# Patient Record
Sex: Male | Born: 1945
Health system: Southern US, Community
[De-identification: ages and names within clinical notes are randomized; demographics above are authoritative.]

## PROBLEM LIST (undated history)

## (undated) DIAGNOSIS — E119 Type 2 diabetes mellitus without complications: Secondary | ICD-10-CM

## (undated) DIAGNOSIS — I509 Heart failure, unspecified: Secondary | ICD-10-CM

## (undated) DIAGNOSIS — I219 Acute myocardial infarction, unspecified: Secondary | ICD-10-CM

## (undated) DIAGNOSIS — J449 Chronic obstructive pulmonary disease, unspecified: Secondary | ICD-10-CM

## (undated) DIAGNOSIS — I10 Essential (primary) hypertension: Secondary | ICD-10-CM

## (undated) HISTORY — PX: CARDIAC SURGERY: SHX584

---

## 2016-04-13 ENCOUNTER — Emergency Department (HOSPITAL_COMMUNITY)
Admission: EM | Admit: 2016-04-13 | Discharge: 2016-04-13 | Disposition: A | Discharge status: Home / Self Care | Payer: Non-veteran care | Attending: Emergency Medicine | Admitting: Emergency Medicine

## 2016-04-13 ENCOUNTER — Encounter (HOSPITAL_COMMUNITY): Payer: Self-pay | Admitting: *Deleted

## 2016-04-13 DIAGNOSIS — L740 Miliaria rubra: Secondary | ICD-10-CM | POA: Diagnosis not present

## 2016-04-13 DIAGNOSIS — I509 Heart failure, unspecified: Secondary | ICD-10-CM | POA: Diagnosis not present

## 2016-04-13 DIAGNOSIS — E119 Type 2 diabetes mellitus without complications: Secondary | ICD-10-CM | POA: Insufficient documentation

## 2016-04-13 DIAGNOSIS — I110 Hypertensive heart disease with heart failure: Secondary | ICD-10-CM | POA: Insufficient documentation

## 2016-04-13 DIAGNOSIS — I252 Old myocardial infarction: Secondary | ICD-10-CM | POA: Diagnosis not present

## 2016-04-13 DIAGNOSIS — X32XXXA Exposure to sunlight, initial encounter: Secondary | ICD-10-CM

## 2016-04-13 DIAGNOSIS — R21 Rash and other nonspecific skin eruption: Secondary | ICD-10-CM | POA: Diagnosis present

## 2016-04-13 HISTORY — DX: Essential (primary) hypertension: I10

## 2016-04-13 HISTORY — DX: Acute myocardial infarction, unspecified: I21.9

## 2016-04-13 HISTORY — DX: Type 2 diabetes mellitus without complications: E11.9

## 2016-04-13 HISTORY — DX: Heart failure, unspecified: I50.9

## 2016-04-13 LAB — CBC
HEMATOCRIT: 42.4 % (ref 39.0–52.0)
HEMOGLOBIN: 13.5 g/dL (ref 13.0–17.0)
MCH: 31.8 pg (ref 26.0–34.0)
MCHC: 31.8 g/dL (ref 30.0–36.0)
MCV: 99.8 fL (ref 78.0–100.0)
Platelets: 278 10*3/uL (ref 150–400)
RBC: 4.25 MIL/uL (ref 4.22–5.81)
RDW: 13.8 % (ref 11.5–15.5)
WBC: 10.6 10*3/uL — ABNORMAL HIGH (ref 4.0–10.5)

## 2016-04-13 LAB — COMPREHENSIVE METABOLIC PANEL
ALBUMIN: 3.4 g/dL — AB (ref 3.5–5.0)
ALT: 23 U/L (ref 17–63)
ANION GAP: 8 (ref 5–15)
AST: 30 U/L (ref 15–41)
Alkaline Phosphatase: 54 U/L (ref 38–126)
BUN: 13 mg/dL (ref 6–20)
CO2: 27 mmol/L (ref 22–32)
Calcium: 9.6 mg/dL (ref 8.9–10.3)
Chloride: 101 mmol/L (ref 101–111)
Creatinine, Ser: 0.76 mg/dL (ref 0.61–1.24)
GFR calc Af Amer: >60 mL/min (ref >60)
GFR calc non Af Amer: >60 mL/min (ref >60)
GLUCOSE: 164 mg/dL — AB (ref 65–99)
POTASSIUM: 3.9 mmol/L (ref 3.5–5.1)
SODIUM: 136 mmol/L (ref 135–145)
Total Bilirubin: 0.5 mg/dL (ref 0.3–1.2)
Total Protein: 7.1 g/dL (ref 6.5–8.1)

## 2016-04-13 MED ORDER — HYDROCORTISONE 1 % EX CREA
TOPICAL_CREAM | CUTANEOUS | Status: DC
Start: 2016-04-13 — End: 2018-11-03

## 2016-04-13 NOTE — Discharge Instructions (Signed)
Heat Rash, Adult Heat rash (miliaria) is a skin condition that causes a rash of little red bumps around the openings to your sweat glands on your skin. It happens when sweat glands are blocked. There are four types of heat rash:  Miliaria crystallina. This is the mildest version. It causes small, clear blisters (vesicles) on the skin.  Miliaria rubra. In this type of heat rash, the ducts are blocked deeper in the skin. The rash is very itchy and consists of small bumps (papules). There may also be a fever.  Miliaria pustulosa. This rash is similar to miliaria rubra, but the bumps are pimples (pustules) rather than papules.  Miliaria profunda. The sweat ducts are blocked deep in the skin. Sometimes flesh-colored papules develop on the skin. This type can lead to heat exhaustion. It requires immediate medical attention. Most cases of heat rash can be managed easily. CAUSES  Heat rash is caused by blocked sweat glands. The sweat glands can become blocked by:  Wearing heavy or tight clothing during exercise or extreme heat.  Having a fever and sweating while in bed.  Certain medicines. RISK FACTORS This condition is more likely to develop:  In people who live or work in hot, humid climates.  During periods of intense exercise and sweating.  In people who are not accustomed to heat. SYMPTOMS  Symptoms of heat rash include:   Small vesicles that break open easily. These vesicles are usually found on the trunk of the body.  Clusters of itchy, red papules or pustules. These often develop on the neck and upper chest, in the groin, under the breasts, and in elbow creases. DIAGNOSIS  Your health care provider may suspect heat rash based on your symptoms. Your health care provider will also do a physical exam. This may include taking a sample of the fluid from your vesicles or pustules for testing. TREATMENT  Moving to a cool, dry place is the best treatment for heat rash. Treatment may also  include medicines, such as:  Corticosteroid creams for skin irritation.  Antibiotic medicines for an infection. HOME CARE INSTRUCTIONS  Skin care  Keep the affected area dry.  Do not use ointments or creams. These can make the condition worse.  Apply cool compresses to the affected areas.  Do not scratch your skin.  Do not take hot showers or baths. General Instructions  Take over-the-counter and prescription medicines only as told by your health care provider.  If you were prescribed an antibiotic, take it as told by your health care provider. Do not stop taking it even if your condition improves.  Stay in a cool room as much as possible. Use an air conditioner or fan, if possible.  Do not wear tight clothes. Wear comfortable, loose-fitting clothing.  Keep all follow-up visits as told by your health care provider. This is important. SEEK MEDICAL CARE IF:   Your heat rash does not go away within several days.  Your heat rash gets worse.  You have a fever or chills. SEEK IMMEDIATE MEDICAL CARE IF:   You are having cramps or muscle contractions.  You have chest pain.  You have trouble breathing.  You faint.   This information is not intended to replace advice given to you by your health care provider. Make sure you discuss any questions you have with your health care provider.   Document Released: 09/21/2009 Document Revised: 06/24/2015 Document Reviewed: 02/18/2015 Elsevier Interactive Patient Education 2016 [employer]  

## 2016-04-13 NOTE — ED Notes (Signed)
The pt is on home 02  And at present

## 2016-04-13 NOTE — ED Notes (Signed)
The pt has stents in his heart but does not take blood thinners.  He has no old records here

## 2016-04-13 NOTE — ED Provider Notes (Signed)
CSN: [id number]     Arrival date & time 04/13/16  2031 History   First MD Initiated Contact with Patient 04/13/16 2151     Chief Complaint  Patient presents with  . Rash     (Consider location/radiation/quality/duration/timing/severity/associated sxs/prior Treatment) Patient is a 70 y.o. male presenting with rash. The history is provided by the patient.  Rash Location:  Torso Torso rash location: chest/abd. Quality: itchiness and redness   Quality: not painful, not peeling and not swelling   Severity:  Moderate Onset quality:  Sudden Duration:  2 days Timing:  Constant Progression:  Improving Chronicity:  New Context: sun exposure   Context comment:  Pt went outside and sitting with his shirt unbuttoned in the sone and after 10min he was bright red Relieved by:  Nothing Worsened by:  Nothing tried Ineffective treatments: ammonia lotion. Associated symptoms: no abdominal pain, no fever, no joint pain, no myalgias, no throat swelling, no tongue swelling, no URI and not wheezing     Past Medical History  Diagnosis Date  . Hypertension   . Diabetes mellitus without complication (HCC)   . CHF (congestive heart failure) (HCC)   . MI (myocardial infarction) (HCC)    History reviewed. No pertinent past surgical history. No family history on file. Social History  Substance Use Topics  . Smoking status: Never Smoker   . Smokeless tobacco: None  . Alcohol Use: No    Review of Systems  Constitutional: Negative for fever.  Respiratory: Negative for wheezing.   Gastrointestinal: Negative for abdominal pain.  Musculoskeletal: Negative for myalgias and arthralgias.  Skin: Positive for rash.  All other systems reviewed and are negative.     Allergies  Sulfa antibiotics and Penicillins  Home Medications   Prior to Admission medications   Medication Sig Start Date End Date Taking? Authorizing Provider  ammonium lactate (AMLACTIN) 12 % cream Apply 1 g topically as needed  for dry skin.   Yes Historical Provider, MD   BP 128/76 mmHg  Pulse 65  Temp(Src) 97.7 F (36.5 C) (Oral)  Resp 18  Ht 5\' 9"  (1.753 m)  Wt 279 lb (126.554 kg)  BMI 41.18 kg/m2  SpO2 100% Physical Exam  Constitutional: He is oriented to person, place, and time. He appears well-developed and well-nourished. No distress.  HENT:  Head: Normocephalic and atraumatic.  Mouth/Throat: Oropharynx is clear and moist.  Eyes: Conjunctivae and EOM are normal. Pupils are equal, round, and reactive to light.  Neck: Normal range of motion. Neck supple.  Cardiovascular: Normal rate.   Pulmonary/Chest: Effort normal.  Musculoskeletal: Normal range of motion. He exhibits no edema or tenderness.  Neurological: He is alert and oriented to person, place, and time.  Skin: Skin is warm and dry. No rash noted. No erythema.  Excoriated maculopapular blanching patchy rash over the chest and abd.  No bleeding or vesicles/pustuals present  Psychiatric: He has a normal mood and affect. His behavior is normal.  Nursing note and vitals reviewed.   ED Course  Procedures (including critical care time) Labs Review Labs Reviewed  COMPREHENSIVE METABOLIC PANEL - Abnormal; Notable for the following:    Glucose, Bld 164 (*)    Albumin 3.4 (*)    All other components within normal limits  CBC - Abnormal; Notable for the following:    WBC 10.6 (*)    All other components within normal limits    Imaging Review No results found. I have personally reviewed and evaluated these images and lab  results as part of my medical decision-making.   EKG Interpretation None      MDM   Final diagnoses:  None    Pt with rash to the chest and abd after going into the sun 2 days ago.  Itchy but improving.  Started after being in the sun for 10min.  Pt takes multiple meds that make him photosensitive and feel most likely the rash is caused by that.  Pt given hydrocortisone cream and instructed to use benadryl  prn.    Gwyneth SproutWhitney Sarp Vernier, MD 04/13/16 2238

## 2016-04-13 NOTE — ED Notes (Signed)
The pt is c/oof a rash on his abd and chest for 2  Days.  2 days ago the pt went out in the sun and was there for 10 minutes  He turned red all over and had a rash instantly.  The pt still has a rash to his chest and abd.  [facility]  And [facility] doctor told him to come here to be seen.  No pain ajnywhere

## 2016-11-23 DIAGNOSIS — J3489 Other specified disorders of nose and nasal sinuses: Secondary | ICD-10-CM | POA: Diagnosis not present

## 2016-11-23 DIAGNOSIS — J449 Chronic obstructive pulmonary disease, unspecified: Secondary | ICD-10-CM | POA: Diagnosis not present

## 2016-11-23 DIAGNOSIS — J069 Acute upper respiratory infection, unspecified: Secondary | ICD-10-CM | POA: Diagnosis not present

## 2016-11-23 DIAGNOSIS — R062 Wheezing: Secondary | ICD-10-CM | POA: Diagnosis not present

## 2018-11-02 ENCOUNTER — Observation Stay (HOSPITAL_COMMUNITY)
Admission: EM | Admit: 2018-11-02 | Discharge: 2018-11-03 | Disposition: A | Discharge status: Home / Self Care | Payer: Medicare HMO | Attending: Internal Medicine | Admitting: Internal Medicine

## 2018-11-02 ENCOUNTER — Other Ambulatory Visit: Payer: Self-pay

## 2018-11-02 ENCOUNTER — Encounter (HOSPITAL_COMMUNITY): Payer: Self-pay | Admitting: Emergency Medicine

## 2018-11-02 ENCOUNTER — Emergency Department (HOSPITAL_COMMUNITY): Payer: Medicare HMO

## 2018-11-02 DIAGNOSIS — R0789 Other chest pain: Principal | ICD-10-CM | POA: Insufficient documentation

## 2018-11-02 DIAGNOSIS — I509 Heart failure, unspecified: Secondary | ICD-10-CM | POA: Insufficient documentation

## 2018-11-02 DIAGNOSIS — I110 Hypertensive heart disease with heart failure: Secondary | ICD-10-CM | POA: Diagnosis not present

## 2018-11-02 DIAGNOSIS — K219 Gastro-esophageal reflux disease without esophagitis: Secondary | ICD-10-CM | POA: Insufficient documentation

## 2018-11-02 DIAGNOSIS — Z880 Allergy status to penicillin: Secondary | ICD-10-CM | POA: Insufficient documentation

## 2018-11-02 DIAGNOSIS — J449 Chronic obstructive pulmonary disease, unspecified: Secondary | ICD-10-CM | POA: Diagnosis not present

## 2018-11-02 DIAGNOSIS — R079 Chest pain, unspecified: Secondary | ICD-10-CM | POA: Diagnosis not present

## 2018-11-02 DIAGNOSIS — Z6835 Body mass index (BMI) 35.0-35.9, adult: Secondary | ICD-10-CM | POA: Insufficient documentation

## 2018-11-02 DIAGNOSIS — N3281 Overactive bladder: Secondary | ICD-10-CM | POA: Insufficient documentation

## 2018-11-02 DIAGNOSIS — Z888 Allergy status to other drugs, medicaments and biological substances status: Secondary | ICD-10-CM | POA: Diagnosis not present

## 2018-11-02 DIAGNOSIS — Z794 Long term (current) use of insulin: Secondary | ICD-10-CM | POA: Insufficient documentation

## 2018-11-02 DIAGNOSIS — E785 Hyperlipidemia, unspecified: Secondary | ICD-10-CM | POA: Insufficient documentation

## 2018-11-02 DIAGNOSIS — G4733 Obstructive sleep apnea (adult) (pediatric): Secondary | ICD-10-CM | POA: Diagnosis not present

## 2018-11-02 DIAGNOSIS — E119 Type 2 diabetes mellitus without complications: Secondary | ICD-10-CM

## 2018-11-02 DIAGNOSIS — I2510 Atherosclerotic heart disease of native coronary artery without angina pectoris: Secondary | ICD-10-CM | POA: Diagnosis not present

## 2018-11-02 DIAGNOSIS — Z7982 Long term (current) use of aspirin: Secondary | ICD-10-CM | POA: Diagnosis not present

## 2018-11-02 DIAGNOSIS — G8929 Other chronic pain: Secondary | ICD-10-CM | POA: Insufficient documentation

## 2018-11-02 DIAGNOSIS — Z882 Allergy status to sulfonamides status: Secondary | ICD-10-CM | POA: Diagnosis not present

## 2018-11-02 DIAGNOSIS — I10 Essential (primary) hypertension: Secondary | ICD-10-CM

## 2018-11-02 DIAGNOSIS — E1140 Type 2 diabetes mellitus with diabetic neuropathy, unspecified: Secondary | ICD-10-CM | POA: Diagnosis not present

## 2018-11-02 DIAGNOSIS — M549 Dorsalgia, unspecified: Secondary | ICD-10-CM | POA: Diagnosis not present

## 2018-11-02 DIAGNOSIS — F329 Major depressive disorder, single episode, unspecified: Secondary | ICD-10-CM | POA: Diagnosis not present

## 2018-11-02 DIAGNOSIS — I252 Old myocardial infarction: Secondary | ICD-10-CM | POA: Insufficient documentation

## 2018-11-02 DIAGNOSIS — Z79899 Other long term (current) drug therapy: Secondary | ICD-10-CM | POA: Diagnosis not present

## 2018-11-02 DIAGNOSIS — R0602 Shortness of breath: Secondary | ICD-10-CM | POA: Diagnosis not present

## 2018-11-02 DIAGNOSIS — Z87891 Personal history of nicotine dependence: Secondary | ICD-10-CM | POA: Insufficient documentation

## 2018-11-02 DIAGNOSIS — I444 Left anterior fascicular block: Secondary | ICD-10-CM | POA: Diagnosis not present

## 2018-11-02 HISTORY — DX: Chronic obstructive pulmonary disease, unspecified: J44.9

## 2018-11-02 LAB — HEPATIC FUNCTION PANEL
ALT: 21 U/L (ref 0–44)
AST: 40 U/L (ref 15–41)
Albumin: 3.5 g/dL (ref 3.5–5.0)
Alkaline Phosphatase: 50 U/L (ref 38–126)
Bilirubin, Direct: 0.4 mg/dL — ABNORMAL HIGH (ref 0.0–0.2)
Indirect Bilirubin: 0.5 mg/dL (ref 0.3–0.9)
Total Bilirubin: 0.9 mg/dL (ref 0.3–1.2)
Total Protein: 7 g/dL (ref 6.5–8.1)

## 2018-11-02 LAB — CBC
HCT: 44.1 % (ref 39.0–52.0)
Hemoglobin: 14.3 g/dL (ref 13.0–17.0)
MCH: 32.1 pg (ref 26.0–34.0)
MCHC: 32.4 g/dL (ref 30.0–36.0)
MCV: 98.9 fL (ref 80.0–100.0)
PLATELETS: 265 10*3/uL (ref 150–400)
RBC: 4.46 MIL/uL (ref 4.22–5.81)
RDW: 13.7 % (ref 11.5–15.5)
WBC: 10.5 10*3/uL (ref 4.0–10.5)
nRBC: 0 % (ref 0.0–0.2)

## 2018-11-02 LAB — BASIC METABOLIC PANEL
ANION GAP: 14 (ref 5–15)
BUN: 17 mg/dL (ref 8–23)
CALCIUM: 9.3 mg/dL (ref 8.9–10.3)
CO2: 24 mmol/L (ref 22–32)
Chloride: 99 mmol/L (ref 98–111)
Creatinine, Ser: 0.98 mg/dL (ref 0.61–1.24)
GFR calc Af Amer: >60 mL/min (ref >60)
GLUCOSE: 280 mg/dL — AB (ref 70–99)
POTASSIUM: 4.5 mmol/L (ref 3.5–5.1)
SODIUM: 137 mmol/L (ref 135–145)

## 2018-11-02 LAB — POCT I-STAT TROPONIN I: Troponin i, poc: 0.01 ng/mL (ref 0.00–0.08)

## 2018-11-02 LAB — LIPASE, BLOOD: Lipase: 23 U/L (ref 11–51)

## 2018-11-02 MED ORDER — PANTOPRAZOLE SODIUM 40 MG PO TBEC
40.0000 mg | DELAYED_RELEASE_TABLET | Freq: Every day | ORAL | Status: DC
  Filled 2018-11-02: qty 1

## 2018-11-02 MED ORDER — NITROGLYCERIN 0.4 MG SL SUBL
0.4000 mg | SUBLINGUAL_TABLET | Freq: Once | SUBLINGUAL | Status: AC
  Administered 2018-11-02: 0.4 mg via SUBLINGUAL
  Filled 2018-11-02: qty 1

## 2018-11-02 MED ORDER — HYDROCODONE-ACETAMINOPHEN 10-325 MG PO TABS
1.0000 | ORAL_TABLET | Freq: Four times a day (QID) | ORAL | Status: DC | PRN
  Administered 2018-11-03 (×2): 1 via ORAL
  Filled 2018-11-02 (×2): qty 1

## 2018-11-02 MED ORDER — ASPIRIN EC 81 MG PO TBEC
81.0000 mg | DELAYED_RELEASE_TABLET | Freq: Every day | ORAL | Status: DC
  Administered 2018-11-03: 81 mg via ORAL
  Filled 2018-11-02: qty 1

## 2018-11-02 MED ORDER — NITROGLYCERIN 0.4 MG SL SUBL: 0.4000 mg | SUBLINGUAL_TABLET | SUBLINGUAL | Status: DC | PRN

## 2018-11-02 MED ORDER — OXYBUTYNIN CHLORIDE ER 5 MG PO TB24
10.0000 mg | ORAL_TABLET | Freq: Two times a day (BID) | ORAL | Status: DC
  Administered 2018-11-03: 10 mg via ORAL
  Filled 2018-11-02: qty 2
  Filled 2018-11-02: qty 1

## 2018-11-02 MED ORDER — PAROXETINE HCL 20 MG PO TABS: 20.0000 mg | ORAL_TABLET | Freq: Every day | ORAL | Status: DC

## 2018-11-02 MED ORDER — PREGABALIN 75 MG PO CAPS
75.0000 mg | ORAL_CAPSULE | Freq: Two times a day (BID) | ORAL | Status: DC
  Administered 2018-11-03 (×2): 75 mg via ORAL
  Filled 2018-11-02 (×2): qty 1

## 2018-11-02 MED ORDER — METOPROLOL SUCCINATE ER 50 MG PO TB24
50.0000 mg | ORAL_TABLET | Freq: Every day | ORAL | Status: DC
  Administered 2018-11-03: 50 mg via ORAL
  Filled 2018-11-02 (×2): qty 1

## 2018-11-02 MED ORDER — INSULIN ASPART 100 UNIT/ML ~~LOC~~ SOLN: 0.0000 [IU] | Freq: Three times a day (TID) | SUBCUTANEOUS | Status: DC

## 2018-11-02 MED ORDER — VITAMIN D 25 MCG (1000 UNIT) PO TABS
2000.0000 [IU] | ORAL_TABLET | Freq: Every day | ORAL | Status: DC | PRN
  Filled 2018-11-02: qty 2

## 2018-11-02 MED ORDER — ENOXAPARIN SODIUM 40 MG/0.4ML ~~LOC~~ SOLN
40.0000 mg | SUBCUTANEOUS | Status: DC
  Administered 2018-11-03: 40 mg via SUBCUTANEOUS
  Filled 2018-11-02 (×2): qty 0.4

## 2018-11-02 MED ORDER — ACETAMINOPHEN 325 MG PO TABS: 650.0000 mg | ORAL_TABLET | ORAL | Status: DC | PRN

## 2018-11-02 NOTE — ED Triage Notes (Signed)
Pt reports central chest pains that radiate to back that started about 45 minutes ago with SOB, weakness. Reports Hx heart attack in 2008. Took ASA but didn't take Nitro for pains.

## 2018-11-02 NOTE — ED Provider Notes (Signed)
[facility]-EMERGENCY DEPT Provider Note   CSN: [id number] Arrival date & time: 11/02/18  1853     History   Chief Complaint Chief Complaint  Patient presents with  . Chest Pain    HPI Adam Stein is a 73 y.o. male.  The history is provided by the patient.  Chest Pain  Pain location:  Substernal area Pain quality: pressure   Pain radiates to:  Does not radiate Pain severity:  Moderate Onset quality:  Gradual Timing:  Constant Progression:  Improving Chronicity:  New Context: at rest   Relieved by:  Aspirin Worsened by:  Nothing Associated symptoms: no abdominal pain, no back pain, no cough, no fever, no heartburn, no orthopnea, no palpitations, no shortness of breath and no vomiting   Risk factors: coronary artery disease, diabetes mellitus and hypertension     Past Medical History:  Diagnosis Date  . CHF (congestive heart failure) (HCC)   . COPD (chronic obstructive pulmonary disease) (HCC)   . Diabetes mellitus without complication (HCC)   . Hypertension   . MI (myocardial infarction) [facility])     Patient Active Problem List   Diagnosis Date Noted  . Chest pain 11/02/2018    Past Surgical History:  Procedure Laterality Date  . CARDIAC SURGERY          Home Medications    Prior to Admission medications   Medication Sig Start Date End Date Taking? Authorizing Provider  aspirin EC 81 MG tablet Take 81 mg by mouth daily as needed (chest pain).    Yes [provider]  cholecalciferol (VITAMIN D) 1000 units tablet Take 2,000 Units by mouth daily as needed (nutrition).    Yes [provider]  HYDROcodone-acetaminophen (NORCO) 10-325 MG tablet Take 1 tablet by mouth every 6 (six) hours as needed for moderate pain or severe pain.   Yes [provider]  metFORMIN (GLUCOPHAGE) 1000 MG tablet Take 1,000 mg by mouth 2 (two) times daily with a meal.   Yes [provider]  metoprolol succinate (TOPROL-XL)  50 MG 24 hr tablet Take 50 mg by mouth daily. Take with or immediately following a meal.   Yes [provider]  oxybutynin (DITROPAN XL) 10 MG 24 hr tablet Take 10 mg by mouth 2 (two) times daily.   Yes [provider]  OXYGEN Inhale 2-4 L into the lungs continuous. 2L at rest, 4L upon exertion   Yes [provider]  PARoxetine (PAXIL) 20 MG tablet Take 20 mg by mouth daily.   Yes [provider]  pregabalin (LYRICA) 75 MG capsule Take 75 mg by mouth 2 (two) times daily.   Yes [provider]  spironolactone (ALDACTONE) 25 MG tablet Take 50 mg by mouth daily.   Yes [provider]  Tea Tree 100 % OIL Apply 1 application topically daily as needed (itching).   Yes [provider]  hydrocortisone cream 1 % Apply to affected area 2 times daily Patient not taking: Reported on 11/02/2018 04/13/16   Gwyneth Sprout, MD  omeprazole (PRILOSEC OTC) 20 MG tablet Take 20 mg by mouth daily as needed (for heartburn).    [provider]    Family History No family history on file.  Social History Social History   Tobacco Use  . Smoking status: Former Games developer  . Smokeless tobacco: Never Used  Substance Use Topics  . Alcohol use: No  . Drug use: Not on file     Allergies  Sulfa antibiotics; Adhesive [tape]; and Penicillins   Review of Systems Review of Systems  Constitutional: Negative for chills and fever.  HENT: Negative for ear pain and sore throat.   Eyes: Negative for pain and visual disturbance.  Respiratory: Negative for cough and shortness of breath.   Cardiovascular: Positive for chest pain. Negative for palpitations and orthopnea.  Gastrointestinal: Negative for abdominal pain, heartburn and vomiting.  Genitourinary: Negative for dysuria and hematuria.  Musculoskeletal: Negative for arthralgias and back pain.  Skin: Negative for color change and rash.  Neurological: Negative for seizures and syncope.  All  other systems reviewed and are negative.    Physical Exam Updated Vital Signs BP 123/78   Pulse 66   Temp 97.9 F (36.6 C)   Resp 15   Ht 5' 9.5" (1.765 m)   Wt 110.2 kg   SpO2 96%   BMI 35.37 kg/m   Physical Exam Vitals signs and nursing note reviewed.  Constitutional:      General: He is not in acute distress.    Appearance: He is well-developed. He is not ill-appearing.  HENT:     Head: Normocephalic and atraumatic.  Eyes:     Conjunctiva/sclera: Conjunctivae normal.  Neck:     Musculoskeletal: Neck supple.  Cardiovascular:     Rate and Rhythm: Normal rate and regular rhythm.     Pulses:          Radial pulses are 2+ on the right side and 2+ on the left side.       Dorsalis pedis pulses are 2+ on the right side and 2+ on the left side.     Heart sounds: Normal heart sounds. No murmur.  Pulmonary:     Effort: Pulmonary effort is normal. No respiratory distress.     Breath sounds: Normal breath sounds. No decreased breath sounds, wheezing or rhonchi.  Abdominal:     Palpations: Abdomen is soft.     Tenderness: There is no abdominal tenderness.  Musculoskeletal: Normal range of motion.     Right lower leg: No edema.     Left lower leg: No edema.  Skin:    General: Skin is warm and dry.     Capillary Refill: Capillary refill takes less than 2 seconds.  Neurological:     General: No focal deficit present.     Mental Status: He is alert.      ED Treatments / Results  Labs (all labs ordered are listed, but only abnormal results are displayed) Labs Reviewed  BASIC METABOLIC PANEL - Abnormal; Notable for the following components:      Result Value   Glucose, Bld 280 (*)    All other components within normal limits  HEPATIC FUNCTION PANEL - Abnormal; Notable for the following components:   Bilirubin, Direct 0.4 (*)    All other components within normal limits  CBC  LIPASE, BLOOD  TROPONIN I  TROPONIN I  TROPONIN I  HEMOGLOBIN A1C  I-STAT TROPONIN, ED    POCT I-STAT TROPONIN I    EKG EKG Interpretation  Date/Time:  Friday November 02 2018 18:58:29 EST Ventricular Rate:  71 PR Interval:    QRS Duration: 94 QT Interval:  396 QTC Calculation: 431 R Axis:   -73 Text Interpretation:  Sinus rhythm Left anterior fascicular block Consider anterior infarct Confirmed by Virgina NorfolkAdam, Azalya Galyon [phone]([id number]) on 11/02/2018 8:54:04 PM   Radiology Dg Chest 2 View  Result Date: 11/02/2018 CLINICAL DATA:  73 year old male with chest pain radiating  to the back acute onset tonight. Shortness of breath and weakness. EXAM: CHEST - 2 VIEW COMPARISON:  None. FINDINGS: Normal cardiac size and mediastinal contours. Mild elevation of the right hemidiaphragm, normal variant. Visualized tracheal air column is within normal limits. No pneumothorax, pulmonary edema, pleural effusion or confluent pulmonary opacity. No acute osseous abnormality identified. Negative visible bowel gas pattern. Probable cholecystectomy clips in the right upper quadrant IMPRESSION: Negative. No acute cardiopulmonary abnormality. Electronically Signed   By: Odessa FlemingH  Hall M.D.   On: 11/02/2018 20:16    Procedures Procedures (including critical care time)  Medications Ordered in ED Medications  aspirin EC tablet 81 mg (has no administration in time range)  HYDROcodone-acetaminophen (NORCO) 10-325 MG per tablet 1 tablet (1 tablet Oral Given 11/03/18 0039)  metoprolol succinate (TOPROL-XL) 24 hr tablet 50 mg (has no administration in time range)  PARoxetine (PAXIL) tablet 20 mg (has no administration in time range)  pantoprazole (PROTONIX) EC tablet 40 mg (has no administration in time range)  oxybutynin (DITROPAN-XL) 24 hr tablet 10 mg (has no administration in time range)  pregabalin (LYRICA) capsule 75 mg (has no administration in time range)  cholecalciferol (VITAMIN D3) tablet 2,000 Units (has no administration in time range)  acetaminophen (TYLENOL) tablet 650 mg (has no administration in time range)   enoxaparin (LOVENOX) injection 40 mg (has no administration in time range)  nitroGLYCERIN (NITROSTAT) SL tablet 0.4 mg (has no administration in time range)  insulin aspart (novoLOG) injection 0-9 Units (has no administration in time range)  nitroGLYCERIN (NITROSTAT) SL tablet 0.4 mg (0.4 mg Sublingual Given 11/02/18 2149)     Initial Impression / Assessment and Plan / ED Course  I have reviewed the triage vital signs and the nursing notes.  Pertinent labs & imaging results that were available during my care of the patient were reviewed by me and considered in my medical decision making (see chart for details).     Adam Stein is a 73 year old male with history of hypertension, diabetes, COPD, CAD who presents to the ED with chest pain.  Patient with normal vitals.  No fever.  Chest pain just prior to arrival.  Took aspirin with some relief.  Patient denies any shortness of breath, cough, sputum production.  Pain occurred while at rest.  Patient had stent placement in his heart in 2008.  Patient with no signs of volume overload, clear breath sounds on exam.  Overall well-appearing.  Patient given nitroglycerin and had improvement of his symptoms.  EKG showed no acute ischemic changes.  No prior to compare to.  Patient with initial troponin within normal limits.  No significant anemia, electrolyte abnormality, kidney injury. No concern for PE or DVT.  Patient with chest x-ray that showed no pneumonia, no pneumothorax, no pleural effusion.  Given his cardiac history will admit for further ACS rule out.  Admitted to hospitalist in stable condition.  This chart was dictated using voice recognition software.  Despite best efforts to proofread,  errors can occur which can change the documentation meaning.  Final Clinical Impressions(s) / ED Diagnoses   Final diagnoses:  Chest pain, unspecified type    ED Discharge Orders    None       Virgina NorfolkCuratolo, Fiza Nation, DO 11/03/18 0115

## 2018-11-03 ENCOUNTER — Observation Stay (HOSPITAL_BASED_OUTPATIENT_CLINIC_OR_DEPARTMENT_OTHER): Payer: Medicare HMO

## 2018-11-03 ENCOUNTER — Other Ambulatory Visit: Payer: Self-pay

## 2018-11-03 ENCOUNTER — Encounter (HOSPITAL_COMMUNITY): Payer: Self-pay | Admitting: Cardiovascular Disease

## 2018-11-03 ENCOUNTER — Ambulatory Visit (HOSPITAL_BASED_OUTPATIENT_CLINIC_OR_DEPARTMENT_OTHER)
Admission: RE | Admit: 2018-11-03 | Discharge: 2018-11-03 | Disposition: A | Discharge status: Home / Self Care | Payer: Medicare HMO | Source: Ambulatory Visit | Attending: Cardiovascular Disease | Admitting: Cardiovascular Disease

## 2018-11-03 DIAGNOSIS — I2510 Atherosclerotic heart disease of native coronary artery without angina pectoris: Secondary | ICD-10-CM

## 2018-11-03 DIAGNOSIS — R072 Precordial pain: Secondary | ICD-10-CM | POA: Diagnosis not present

## 2018-11-03 DIAGNOSIS — I10 Essential (primary) hypertension: Secondary | ICD-10-CM

## 2018-11-03 DIAGNOSIS — E789 Disorder of lipoprotein metabolism, unspecified: Secondary | ICD-10-CM

## 2018-11-03 DIAGNOSIS — I25118 Atherosclerotic heart disease of native coronary artery with other forms of angina pectoris: Secondary | ICD-10-CM

## 2018-11-03 DIAGNOSIS — E119 Type 2 diabetes mellitus without complications: Secondary | ICD-10-CM

## 2018-11-03 DIAGNOSIS — R079 Chest pain, unspecified: Secondary | ICD-10-CM

## 2018-11-03 DIAGNOSIS — I249 Acute ischemic heart disease, unspecified: Secondary | ICD-10-CM

## 2018-11-03 DIAGNOSIS — J449 Chronic obstructive pulmonary disease, unspecified: Secondary | ICD-10-CM

## 2018-11-03 LAB — HEMOGLOBIN A1C
HEMOGLOBIN A1C: 8.1 % — AB (ref 4.8–5.6)
Mean Plasma Glucose: 185.77 mg/dL

## 2018-11-03 LAB — TROPONIN I: Troponin I: <0.03 ng/mL (ref <0.03)

## 2018-11-03 LAB — ECHOCARDIOGRAM COMPLETE
Height: 69.5 in
Weight: 3888 oz

## 2018-11-03 LAB — NM MYOCAR MULTI W/SPECT W/WALL MOTION / EF
Estimated workload: 1 METS
MPHR: 148 {beats}/min
Peak HR: 70 {beats}/min
Percent HR: 47 %
Rest HR: 60 {beats}/min

## 2018-11-03 LAB — LIPID PANEL
Cholesterol: 177 mg/dL (ref 0–200)
HDL: 28 mg/dL — ABNORMAL LOW (ref >40)
LDL Cholesterol: 72 mg/dL (ref 0–99)
Total CHOL/HDL Ratio: 6.3 RATIO
Triglycerides: 384 mg/dL — ABNORMAL HIGH (ref <150)
VLDL: 77 mg/dL — ABNORMAL HIGH (ref 0–40)

## 2018-11-03 LAB — GLUCOSE, CAPILLARY
Glucose-Capillary: 186 mg/dL — ABNORMAL HIGH (ref 70–99)
Glucose-Capillary: 197 mg/dL — ABNORMAL HIGH (ref 70–99)
Glucose-Capillary: 251 mg/dL — ABNORMAL HIGH (ref 70–99)
Glucose-Capillary: 197 mg/dL — ABNORMAL HIGH (ref 70–99)

## 2018-11-03 MED ORDER — TECHNETIUM TC 99M TETROFOSMIN IV KIT
10.0000 | PACK | Freq: Once | INTRAVENOUS | Status: AC | PRN
  Administered 2018-11-03: 10 via INTRAVENOUS

## 2018-11-03 MED ORDER — REGADENOSON 0.4 MG/5ML IV SOLN
0.4000 mg | Freq: Once | INTRAVENOUS | Status: AC
  Administered 2018-11-03: 0.4 mg via INTRAVENOUS

## 2018-11-03 MED ORDER — INSULIN ASPART 100 UNIT/ML ~~LOC~~ SOLN: 0.0000 [IU] | Freq: Every day | SUBCUTANEOUS | Status: DC

## 2018-11-03 MED ORDER — ATORVASTATIN CALCIUM 40 MG PO TABS: 40.0000 mg | ORAL_TABLET | Freq: Every day | ORAL | Status: DC

## 2018-11-03 MED ORDER — TECHNETIUM TC 99M TETROFOSMIN IV KIT
30.0000 | PACK | Freq: Once | INTRAVENOUS | Status: AC | PRN
  Administered 2018-11-03: 30 via INTRAVENOUS

## 2018-11-03 MED ORDER — INSULIN ASPART 100 UNIT/ML ~~LOC~~ SOLN
0.0000 [IU] | Freq: Three times a day (TID) | SUBCUTANEOUS | Status: DC
  Administered 2018-11-03: 5 [IU] via SUBCUTANEOUS

## 2018-11-03 MED ORDER — ATORVASTATIN CALCIUM 40 MG PO TABS: 40.0000 mg | ORAL_TABLET | Freq: Every day | ORAL | 0 refills | Status: AC

## 2018-11-03 MED ORDER — OXYBUTYNIN CHLORIDE ER 10 MG PO TB24
10.0000 mg | ORAL_TABLET | ORAL | Status: AC
  Administered 2018-11-03: 10 mg via ORAL
  Filled 2018-11-03: qty 1

## 2018-11-03 MED ORDER — REGADENOSON 0.4 MG/5ML IV SOLN
INTRAVENOUS | Status: AC
  Filled 2018-11-03: qty 5

## 2018-11-03 NOTE — H&P (Signed)
History and Physical    Adam Stein ZOX:[id number] DOB: [birth year] DOA: 11/02/2018  [facility] Patient coming from: Home  Chief Complaint: Chest pain  HPI: Adam Stein is a 73 y.o. male with medical history significant of CHF, COPD, type 2 diabetes, hypertension, CAD, prior MI status post PCI presenting to the hospital for evaluation of chest pain.  Patient states he started having substernal chest pain around 6 PM today while walking.  He describes the pain as sharp and radiating to his back.  He took 4 baby aspirin tablets at home.  His chest pain resolved after he received nitroglycerin in the emergency room.  Denies having any chest pain at present.  Chest pain was associated with dyspnea.  Not associated with diaphoresis or nausea.  States he had an MI in 2008 in [place] and 2 stents were placed at that time.  He does not have a cardiologist.  [facility]  Review of Systems: As per HPI otherwise 10 point review of systems negative.  Past Medical History:  Diagnosis Date  . CHF (congestive heart failure) (HCC)   . COPD (chronic obstructive pulmonary disease) (HCC)   . Diabetes mellitus without complication (HCC)   . Hypertension   . MI (myocardial infarction) [facility](HCC)     Past Surgical History:  Procedure Laterality Date  . CARDIAC SURGERY       reports that he has quit smoking. He has never used smokeless tobacco. He reports that he does not drink alcohol. No history on file for drug.  Allergies  Allergen Reactions  . Sulfa Antibiotics Other (See Comments)    Skin comes off  . Adhesive [Tape] Other (See Comments)    Causing blisters; Bandages also  . Penicillins Itching and Rash    No family history on file.  Prior to Admission medications   Medication Sig Start Date End Date Taking? Authorizing Provider  aspirin EC 81 MG tablet Take 81 mg by mouth daily as needed (chest pain).    Yes [provider]    cholecalciferol (VITAMIN D) 1000 units tablet Take 2,000 Units by mouth daily as needed (nutrition).    Yes [provider]  HYDROcodone-acetaminophen (NORCO) 10-325 MG tablet Take 1 tablet by mouth every 6 (six) hours as needed for moderate pain or severe pain.   Yes [provider]  metFORMIN (GLUCOPHAGE) 1000 MG tablet Take 1,000 mg by mouth 2 (two) times daily with a meal.   Yes [provider]  metoprolol succinate (TOPROL-XL) 50 MG 24 hr tablet Take 50 mg by mouth daily. Take with or immediately following a meal.   Yes [provider]  oxybutynin (DITROPAN XL) 10 MG 24 hr tablet Take 10 mg by mouth 2 (two) times daily.   Yes [provider]  OXYGEN Inhale 2-4 L into the lungs continuous. 2L at rest, 4L upon exertion   Yes [provider]  PARoxetine (PAXIL) 20 MG tablet Take 20 mg by mouth daily.   Yes [provider]  pregabalin (LYRICA) 75 MG capsule Take 75 mg by mouth 2 (two) times daily.   Yes [provider]  spironolactone (ALDACTONE) 25 MG tablet Take 50 mg by mouth daily.   Yes [provider]  Tea Tree 100 % OIL Apply 1 application topically daily as needed (itching).   Yes [provider]  hydrocortisone cream 1 % Apply to affected area 2 times daily Patient not taking:  Reported on 11/02/2018 04/13/16   Gwyneth Sprout, MD  omeprazole (PRILOSEC OTC) 20 MG tablet Take 20 mg by mouth daily as needed (for heartburn).    [provider]    Physical Exam: Vitals:   11/03/18 0200 11/03/18 0230 11/03/18 0300 11/03/18 0330  BP: 121/76 132/70 (!) 119/104 120/73  Pulse: 63 (!) 53 74 69  Resp: 18 15 13 12   Temp:      TempSrc:      SpO2: 94% 92% 93% 92%  Weight:      Height:        Physical Exam  Constitutional: He is oriented to person, place, and time. He appears well-developed and well-nourished. No distress.  HENT:  Head: Normocephalic.  Mouth/Throat: Oropharynx is clear and  moist.  Eyes: Right eye exhibits no discharge. Left eye exhibits no discharge.  Neck: Neck supple.  Cardiovascular: Normal rate, regular rhythm and intact distal pulses.  Pulmonary/Chest: Effort normal and breath sounds normal. No respiratory distress. He has no wheezes. He has no rales.  Abdominal: Soft. Bowel sounds are normal. He exhibits no distension. There is no abdominal tenderness. There is no guarding.  Musculoskeletal:     Comments: Trace pedal edema  Neurological: He is alert and oriented to person, place, and time.  Skin: Skin is warm and dry. He is not diaphoretic.  Psychiatric: He has a normal mood and affect. His behavior is normal.     Labs on Admission: I have personally reviewed following labs and imaging studies  CBC: Recent Labs  Lab 11/02/18 1919  WBC 10.5  HGB 14.3  HCT 44.1  MCV 98.9  PLT 265   Basic Metabolic Panel: Recent Labs  Lab 11/02/18 1919  NA 137  K 4.5  CL 99  CO2 24  GLUCOSE 280*  BUN 17  CREATININE 0.98  CALCIUM 9.3   GFR: Estimated Creatinine Clearance: 84 mL/min (by C-G formula based on SCr of 0.98 mg/dL). Liver Function Tests: Recent Labs  Lab 11/02/18 2211  AST 40  ALT 21  ALKPHOS 50  BILITOT 0.9  PROT 7.0  ALBUMIN 3.5   Recent Labs  Lab 11/02/18 2211  LIPASE 23   No results for input(s): AMMONIA in the last 168 hours. Coagulation Profile: No results for input(s): INR, PROTIME in the last 168 hours. Cardiac Enzymes: Recent Labs  Lab 11/03/18 0041  TROPONINI <0.03   BNP (last 3 results) No results for input(s): PROBNP in the last 8760 hours. HbA1C: No results for input(s): HGBA1C in the last 72 hours. CBG: Recent Labs  Lab 11/03/18 0152  GLUCAP 186*   Lipid Profile: No results for input(s): CHOL, HDL, LDLCALC, TRIG, CHOLHDL, LDLDIRECT in the last 72 hours. Thyroid Function Tests: No results for input(s): TSH, T4TOTAL, FREET4, T3FREE, THYROIDAB in the last 72 hours. Anemia Panel: No results for  input(s): VITAMINB12, FOLATE, FERRITIN, TIBC, IRON, RETICCTPCT in the last 72 hours. Urine analysis: No results found for: COLORURINE, APPEARANCEUR, LABSPEC, PHURINE, GLUCOSEU, HGBUR, BILIRUBINUR, KETONESUR, PROTEINUR, UROBILINOGEN, NITRITE, LEUKOCYTESUR  Radiological Exams on Admission: Dg Chest 2 View  Result Date: 11/02/2018 CLINICAL DATA:  73 year old male with chest pain radiating to the back acute onset tonight. Shortness of breath and weakness. EXAM: CHEST - 2 VIEW COMPARISON:  None. FINDINGS: Normal cardiac size and mediastinal contours. Mild elevation of the right hemidiaphragm, normal variant. Visualized tracheal air column is within normal limits. No pneumothorax, pulmonary edema, pleural effusion or confluent pulmonary opacity. No acute osseous abnormality identified. Negative visible bowel  gas pattern. Probable cholecystectomy clips in the right upper quadrant IMPRESSION: Negative. No acute cardiopulmonary abnormality. Electronically Signed   By: Odessa Fleming M.D.   On: 11/02/2018 20:16    EKG: Independently reviewed.  Sinus rhythm, LAFB.  No prior EKG for comparison.  Assessment/Plan Principal Problem:   Chest pain Active Problems:   CAD (coronary artery disease)   Type 2 diabetes mellitus (HCC)   HTN (hypertension)   COPD (chronic obstructive pulmonary disease) (HCC)   Chest pain, history of CAD Patient has a history of prior MI and PCI (2 stents) in 2008 in [place].  Experienced substernal chest pain while walking, relieved with nitroglycerin.  He describes the chest pain as sharp and radiating to his back.  Chest x-ray negative for acute finding.  PE less likely as patient is not hypoxic, tachycardic, or tachypneic.  Troponin x2 negative.  EKG with LAFB in the setting of prior MI; no prior tracing for comparison.  Patient is currently pain-free and in no distress. -Cardiac monitoring -Patient took full dose aspirin at home. -Continue aspirin, beta-blocker -Currently not on a  statin.  Check lipid panel. -Sublingual nitroglycerin as needed -Continue to trend troponin -Echocardiogram -Consult cardiology in the morning.  Type 2 diabetes Most recent CBG 186. -Check A1c -Sliding scale insulin sensitive with bedtime coverage -CBG checks  COPD -Stable.  No bronchospasm.  Currently not on any home inhalers.  CHF -Stable.  Continue home beta-blocker.  Hypertension -Currently normotensive.  Continue home beta-blocker.  Chronic back pain -Continue home Norco as needed  Overactive bladder -Continue home oxybutynin  GERD -Continue PPI  Depression -Continue Paxil  Neuropathy -Continue Lyrica  DVT prophylaxis: Lovenox Code Status: Patient wishes to be full code. Family Communication: No family available. Disposition Plan: Anticipate discharge in 1 to 2 days. Consults called: None Admission status: Observation, telemetry  John Giovanni MD [facility] Pager [phone]  If 7PM-7AM, please contact night-coverage [email or web address] Password [facility]  11/03/2018, 4:25 AM

## 2018-11-03 NOTE — ED Notes (Signed)
ED TO INPATIENT HANDOFF REPORT  Name/Age/Gender Adam Stein 73 y.o. male  Code Status    Code Status Orders  (From admission, onward)         Start     Ordered   11/02/18 2355  Full code  Continuous     11/02/18 2357        Code Status History    This patient has a current code status but no historical code status.      Home/SNF/Other Home  Chief Complaint possible heart attack  Level of Care/Admitting Diagnosis ED Disposition    ED Disposition Condition Comment   Admit  [facility] [[id number]]  Level of Care: Telemetry [5]  Admit to tele based on following criteria: Monitor for Ischemic changes  Diagnosis: Chest pain [[id number]][[id number]]  Admitting Physician: John GiovanniRATHORE, VASUNDHRA [[id number]]  Attending Physician: John GiovanniATHORE, VASUNDHRA [[id number]]  PT Class (Do Not Modify): Observation [104]  PT Acc Code (Do Not Modify): Observation [10022]       Medical History Past Medical History:  Diagnosis Date  . CHF (congestive heart failure) (HCC)   . COPD (chronic obstructive pulmonary disease) (HCC)   . Diabetes mellitus without complication (HCC)   . Hypertension   . MI (myocardial infarction) (HCC)     Allergies Allergies  Allergen Reactions  . Sulfa Antibiotics Other (See Comments)    Skin comes off  . Adhesive [Tape] Other (See Comments)    Causing blisters; Bandages also  . Penicillins Itching and Rash    IV Location/Drains/Wounds Patient Lines/Drains/Airways Status   Active Line/Drains/Airways    Name:   Placement date:   Placement time:   Site:   Days:   Peripheral IV 11/02/18 Right Wrist   11/02/18    2212    Wrist   1          Labs/Imaging Results for orders placed or performed during the hospital encounter of 11/02/18 (from the past 48 hour(s))  Basic metabolic panel     Status: Abnormal   Collection Time: 11/02/18  7:19 PM  Result Value Ref Range   Sodium 137 135 - 145 mmol/L    Comment: POST-ULTRACENTRIFUGATION   Potassium 4.5 3.5 - 5.1 mmol/L    Comment: SLIGHT HEMOLYSIS   Chloride 99 98 - 111 mmol/L   CO2 24 22 - 32 mmol/L   Glucose, Bld 280 (H) 70 - 99 mg/dL   BUN 17 8 - 23 mg/dL   Creatinine, Ser 6.430.98 0.61 - 1.24 mg/dL   Calcium 9.3 8.9 - 32.910.3 mg/dL   GFR calc non Af Amer >60 >60 mL/min   GFR calc Af Amer >60 >60 mL/min   Anion gap 14 5 - 15    Comment: Performed at [facility], [address] [place], [place] [address]  CBC     Status: None   Collection Time: 11/02/18  7:19 PM  Result Value Ref Range   WBC 10.5 4.0 - 10.5 K/uL   RBC 4.46 4.22 - 5.81 MIL/uL   Hemoglobin 14.3 13.0 - 17.0 g/dL   HCT 16.644.1 06.339.0 - 01.652.0 %   MCV 98.9 80.0 - 100.0 fL   MCH 32.1 26.0 - 34.0 pg   MCHC 32.4 30.0 - 36.0 g/dL   RDW 01.013.7 93.211.5 - 35.515.5 %   Platelets 265 150 - 400 K/uL   nRBC 0.0 0.0 - 0.2 %    Comment: Performed at [facility], [address] [place] [address]  POCT i-Stat troponin I     Status: None   Collection Time: 11/02/18  7:29 PM  Result Value Ref Range   Troponin i, poc 0.01 0.00 - 0.08 ng/mL   Comment 3            Comment: Due to the release kinetics of cTnI, a negative result within the first hours of the onset of symptoms does not rule out myocardial infarction with certainty. If myocardial infarction is still suspected, repeat the test at appropriate intervals.   Hepatic function panel     Status: Abnormal   Collection Time: 11/02/18 10:11 PM  Result Value Ref Range   Total Protein 7.0 6.5 - 8.1 g/dL    Comment: POST-ULTRACENTRIFUGATION   Albumin 3.5 3.5 - 5.0 g/dL   AST 40 15 - 41 U/L   ALT 21 0 - 44 U/L   Alkaline Phosphatase 50 38 - 126 U/L   Total Bilirubin 0.9 0.3 - 1.2 mg/dL   Bilirubin, Direct 0.4 (H) 0.0 - 0.2 mg/dL   Indirect Bilirubin 0.5 0.3 - 0.9 mg/dL    Comment: Performed at [facility], [address], [place], [place] [address]  Lipase, blood     Status: None   Collection Time: 11/02/18 10:11 PM  Result Value Ref  Range   Lipase 23 11 - 51 U/L    Comment: Performed at [facility], [address] [place], [place] [address]  Troponin I - Now Then Q6H     Status: None   Collection Time: 11/03/18 12:41 AM  Result Value Ref Range   Troponin I <0.03 <0.03 ng/mL    Comment: Performed at [facility], [address], [place], [place] [address]  Glucose, capillary     Status: Abnormal   Collection Time: 11/03/18  1:52 AM  Result Value Ref Range   Glucose-Capillary 186 (H) 70 - 99 mg/dL  Lipid panel     Status: Abnormal   Collection Time: 11/03/18  5:54 AM  Result Value Ref Range   Cholesterol 177 0 - 200 mg/dL   Triglycerides 469 (H) <150 mg/dL   HDL 28 (L) >62 mg/dL   Total CHOL/HDL Ratio 6.3 RATIO   VLDL 77 (H) 0 - 40 mg/dL   LDL Cholesterol 72 0 - 99 mg/dL    Comment:        Total Cholesterol/HDL:CHD Risk Coronary Heart Disease Risk Table                     Men   Women  1/2 Average Risk   3.4   3.3  Average Risk       5.0   4.4  2 X Average Risk   9.6   7.1  3 X Average Risk  23.4   11.0        Use the calculated Patient Ratio above and the CHD Risk Table to determine the patient's CHD Risk.        ATP III CLASSIFICATION (LDL):  <100     mg/dL   Optimal  952-841  mg/dL   Near or Above                    Optimal  130-159  mg/dL   Borderline  324-401  mg/dL   High  >027     mg/dL   Very High Performed at [facility], [address] [place], [place]  [address]    Dg Chest 2 View  Result Date: 11/02/2018 CLINICAL DATA:  73 year old male with chest pain radiating to the back acute onset tonight. Shortness of breath and weakness. EXAM: CHEST - 2 VIEW COMPARISON:  None. FINDINGS: Normal cardiac size and mediastinal contours. Mild elevation of the right hemidiaphragm, normal variant. Visualized tracheal air column is within normal limits. No pneumothorax, pulmonary edema, pleural effusion or confluent pulmonary opacity. No acute osseous  abnormality identified. Negative visible bowel gas pattern. Probable cholecystectomy clips in the right upper quadrant IMPRESSION: Negative. No acute cardiopulmonary abnormality. Electronically Signed   By: Odessa Fleming M.D.   On: 11/02/2018 20:16   EKG Interpretation  Date/Time:  Friday November 02 2018 18:58:29 EST Ventricular Rate:  71 PR Interval:    QRS Duration: 94 QT Interval:  396 QTC Calculation: 431 R Axis:   -73 Text Interpretation:  Sinus rhythm Left anterior fascicular block Consider anterior infarct Confirmed by Virgina Norfolk [phone]) on 11/02/2018 8:54:04 PM   Pending Labs Unresulted Labs (From admission, onward)    Start     Ordered   11/03/18 0500  Hemoglobin A1c  Tomorrow morning,   R    Comments:  To assess prior glycemic control    11/02/18 2357   11/02/18 2355  Troponin I - Now Then Q6H  Now then every 6 hours,   TIMED     11/02/18 2357          Vitals/Pain Today's Vitals   11/03/18 0400 11/03/18 0530 11/03/18 0600 11/03/18 0630  BP: 119/73 123/72 (!) 108/95 123/74  Pulse: 67 70 70 68  Resp: 13 18 17 12   Temp:      TempSrc:      SpO2: 94% 96% 97% 96%  Weight:      Height:      PainSc:        Isolation Precautions No active isolations  Medications Medications  aspirin EC tablet 81 mg (has no administration in time range)  HYDROcodone-acetaminophen (NORCO) 10-325 MG per tablet 1 tablet (1 tablet Oral Given 11/03/18 0039)  metoprolol succinate (TOPROL-XL) 24 hr tablet 50 mg (has no administration in time range)  PARoxetine (PAXIL) tablet 20 mg (has no administration in time range)  pantoprazole (PROTONIX) EC tablet 40 mg (has no administration in time range)  oxybutynin (DITROPAN-XL) 24 hr tablet 10 mg (has no administration in time range)  pregabalin (LYRICA) capsule 75 mg (75 mg Oral Given 11/03/18 0208)  cholecalciferol (VITAMIN D3) tablet 2,000 Units (has no administration in time range)  acetaminophen (TYLENOL) tablet 650 mg (has no administration  in time range)  enoxaparin (LOVENOX) injection 40 mg (has no administration in time range)  nitroGLYCERIN (NITROSTAT) SL tablet 0.4 mg (has no administration in time range)  insulin aspart (novoLOG) injection 0-9 Units (has no administration in time range)  insulin aspart (novoLOG) injection 0-5 Units (0 Units Subcutaneous Not Given 11/03/18 0209)  nitroGLYCERIN (NITROSTAT) SL tablet 0.4 mg (0.4 mg Sublingual Given 11/02/18 2149)  oxybutynin (DITROPAN-XL) 24 hr tablet 10 mg (10 mg Oral Given 11/03/18 0223)    Mobility walks with device

## 2018-11-03 NOTE — Discharge Summary (Signed)
Physician Discharge Summary  Patient ID: Adam Stein MRN: [id number] DOB/AGE: 1946-07-18 73 y.o.  Admit date: 11/02/2018 Discharge date: 11/03/2018  Admission Diagnoses:  Discharge Diagnoses:  Principal Problem:   Chest pain Active Problems:   CAD (coronary artery disease)   Type 2 diabetes mellitus (HCC)   HTN (hypertension)   COPD (chronic obstructive pulmonary disease) (HCC)   Discharged Condition: stable  Hospital Course: Patient is a 73 year old male, morbidly obese, with past medical history significant for CHF, COPD, diabetes mellitus type 2, hypertension, coronary artery disease/MI status post PCI several years ago, OSA but informed me that he lost about 100 pounds and was no longer using CPAP machine.  Patient was admitted with chest pain.  Patient was admitted for further assessment and management.  Cardiac enzymes were cycled.  Cardiac enzymes came back negative.  Cardiology team was consulted.  Patient proceeded with echocardiogram and pharmacological nuclear stress test.  Cardiac nuclear stress test came back negative for cardiac ischemia.  Patient's chest pain has resolved.  Cardiology team has cleared patient for discharge.  Patient will follow with primary care provider and cardiology team.  Patient has been advised to repeat sleep study.  Patient is also eager to be discharged back on.  Consults: cardiology  Significant Diagnostic Studies:  Echocardiogram revealed EF of 60 to 65% and grade 1 diastolic dysfunction. Pharmacological nuclear stress test was negative for cardiac ischemia.  Discharge Exam: Blood pressure 129/77, pulse 70, temperature 97.9 F (36.6 C), temperature source Oral, resp. rate 20, height 5' 9.5" (1.765 m), weight 110.2 kg, SpO2 96 %.  Disposition: Discharge disposition: 01-Home or Self Care   Discharge Instructions    Diet - low sodium heart healthy   Complete by:  As directed    Increase activity slowly   Complete by:  As directed      Allergies as of 11/03/2018      Reactions   Sulfa Antibiotics Other (See Comments)   Skin comes off   Adhesive [tape] Other (See Comments)   Causing blisters; Bandages also   Penicillins Itching, Rash      Medication List    STOP taking these medications   hydrocortisone cream 1 %   Tea Tree 100 % Oil     TAKE these medications   aspirin EC 81 MG tablet Take 81 mg by mouth daily as needed (chest pain).   atorvastatin 40 MG tablet Commonly known as:  LIPITOR Take 1 tablet (40 mg total) by mouth daily at 6 PM.   cholecalciferol 1000 units tablet Commonly known as:  VITAMIN D Take 2,000 Units by mouth daily as needed (nutrition).   DITROPAN XL 10 MG 24 hr tablet Generic drug:  oxybutynin Take 10 mg by mouth 2 (two) times daily.   HYDROcodone-acetaminophen 10-325 MG tablet Commonly known as:  NORCO Take 1 tablet by mouth every 6 (six) hours as needed for moderate pain or severe pain.   metFORMIN 1000 MG tablet Commonly known as:  GLUCOPHAGE Take 1,000 mg by mouth 2 (two) times daily with a meal.   metoprolol succinate 50 MG 24 hr tablet Commonly known as:  TOPROL-XL Take 50 mg by mouth daily. Take with or immediately following a meal.   omeprazole 20 MG tablet Commonly known as:  PRILOSEC OTC Take 20 mg by mouth daily as needed (for heartburn).   OXYGEN Inhale 2-4 L into the lungs continuous. 2L at rest, 4L upon exertion   PARoxetine 20 MG tablet Commonly known as:  PAXIL Take 20 mg by mouth daily.   pregabalin 75 MG capsule Commonly known as:  LYRICA Take 75 mg by mouth 2 (two) times daily.   spironolactone 25 MG tablet Commonly known as:  ALDACTONE Take 50 mg by mouth daily.        SignedBarnetta Chapel: Meghan Tiemann I Meshulem Onorato 11/03/2018, 2:39 PM

## 2018-11-03 NOTE — Consult Note (Signed)
Cardiology Consultation:   Patient ID: Adam Stein MRN: [id number]; DOB: April 13, 1946  Admit date: 11/02/2018 Date of Consult: 11/03/2018  Primary Care Provider: [facility] Primary Cardiologist:  [facility],   Calee Nugent at [facility]. Primary Electrophysiologist:  None    Patient Profile:   Adam Stein Rammel is a 73 y.o. male with a hx of coronary artery disease, congestive heart failure, COPD, type 2 diabetes mellitus, hypertension who is being seen today for the evaluation of chest discomfort at the request of  Dr. Loney Lohathore.  History of Present Illness:   Mr. Adam Stein is a 73 year old gentleman who is typically [facility] at the [facility].  He has a history of coronary artery disease in the past.  He has had a myocardial infarction in 2008 while in [place].  He had 2 stents placed at that time.  I do not have records from his MI.  He is a moderately active gentleman.  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  He has goats and chickens remains fairly active with that.  He does not get any real exercise.  He was walking in the house yesterday evening and developed sudden substernal sharp chest pain.  The pain felt very similar to his previous episode of angina/myocardial infarction in 2008.  The pain lasted for several hours and was eventually relieved with sublingual nitroglycerin The pain was associated with some shortness of breath and some cold sweats.  He denies any syncope. Denies any rash.  He denies any fever or chills.  He he denies any cough or shortness of breath at baseline.   Troponin was negative.  Were awaiting second and third troponin levels. EKG shows no ST or T wave changes.   Past Medical History:  Diagnosis Date  . CHF (congestive heart failure) (HCC)   . COPD (chronic obstructive pulmonary disease) (HCC)   . Diabetes mellitus without complication (HCC)   . Hypertension   . MI (myocardial infarction) [facility](HCC)     Past Surgical  History:  Procedure Laterality Date  . CARDIAC SURGERY       Home Medications:  Prior to Admission medications   Medication Sig Start Date End Date Taking? Authorizing Provider  aspirin EC 81 MG tablet Take 81 mg by mouth daily as needed (chest pain).    Yes [provider]  cholecalciferol (VITAMIN D) 1000 units tablet Take 2,000 Units by mouth daily as needed (nutrition).    Yes [provider]  HYDROcodone-acetaminophen (NORCO) 10-325 MG tablet Take 1 tablet by mouth every 6 (six) hours as needed for moderate pain or severe pain.   Yes [provider]  metFORMIN (GLUCOPHAGE) 1000 MG tablet Take 1,000 mg by mouth 2 (two) times daily with a meal.   Yes [provider]  metoprolol succinate (TOPROL-XL) 50 MG 24 hr tablet Take 50 mg by mouth daily. Take with or immediately following a meal.   Yes [provider]  oxybutynin (DITROPAN XL) 10 MG 24 hr tablet Take 10 mg by mouth 2 (two) times daily.   Yes [provider]  OXYGEN Inhale 2-4 Stein into the lungs continuous. 2L at rest, 4L upon exertion   Yes [provider]  PARoxetine (PAXIL) 20 MG tablet Take 20 mg by mouth daily.   Yes [provider]  pregabalin (LYRICA) 75 MG capsule Take 75 mg by mouth 2 (two) times daily.   Yes [provider]  spironolactone (ALDACTONE) 25 MG tablet Take 50 mg by  mouth daily.   Yes [provider]  Tea Tree 100 % OIL Apply 1 application topically daily as needed (itching).   Yes [provider]  hydrocortisone cream 1 % Apply to affected area 2 times daily Patient not taking: Reported on 11/02/2018 04/13/16   Gwyneth Sprout, MD  omeprazole (PRILOSEC OTC) 20 MG tablet Take 20 mg by mouth daily as needed (for heartburn).    [provider]    Inpatient Medications: Scheduled Meds: . aspirin EC  81 mg Oral Daily  . enoxaparin (LOVENOX) injection  40 mg Subcutaneous Q24H  . insulin aspart  0-5 Units  Subcutaneous QHS  . insulin aspart  0-9 Units Subcutaneous TID WC  . metoprolol succinate  50 mg Oral Daily  . oxybutynin  10 mg Oral BID  . pantoprazole  40 mg Oral Daily  . PARoxetine  20 mg Oral Daily  . pregabalin  75 mg Oral BID   Continuous Infusions:  PRN Meds: acetaminophen, cholecalciferol, HYDROcodone-acetaminophen, nitroGLYCERIN  Allergies:    Allergies  Allergen Reactions  . Sulfa Antibiotics Other (See Comments)    Skin comes off  . Adhesive [Tape] Other (See Comments)    Causing blisters; Bandages also  . Penicillins Itching and Rash    Social History:   Social History   Socioeconomic History  . Marital status: Divorced    Spouse name: Not on file  . Number of children: Not on file  . Years of education: Not on file  . Highest education level: Not on file  Occupational History  . Not on file  Social Needs  . Financial resource strain: Not on file  . Food insecurity:    Worry: Not on file    Inability: Not on file  . Transportation needs:    Medical: Not on file    Non-medical: Not on file  Tobacco Use  . Smoking status: Former Games developer  . Smokeless tobacco: Never Used  Substance and Sexual Activity  . Alcohol use: No  . Drug use: Not on file  . Sexual activity: Not on file  Lifestyle  . Physical activity:    Days per week: Not on file    Minutes per session: Not on file  . Stress: Not on file  Relationships  . Social connections:    Talks on phone: Not on file    Gets together: Not on file    Attends religious service: Not on file    Active member of club or organization: Not on file    Attends meetings of clubs or organizations: Not on file    Relationship status: Not on file  . Intimate partner violence:    Fear of current or ex partner: Not on file    Emotionally abused: Not on file    Physically abused: Not on file    Forced sexual activity: Not on file  Other Topics Concern  . Not on file  Social History Narrative  . Not on file      Family History:   Family History  Problem Relation Age of Onset  . Diabetes Mother   . Cancer Mother      ROS:  Please see the history of present illness.   All other ROS reviewed and negative.     Physical Exam/Data:   Vitals:   11/03/18 0530 11/03/18 0600 11/03/18 0630 11/03/18 0828  BP: 123/72 (!) 108/95 123/74 140/63  Pulse: 70 70 68 64  Resp: 18 17 12 14   Temp:  97.9 F (36.6 C)  TempSrc:    Oral  SpO2: 96% 97% 96% 95%  Weight:      Height:       No intake or output data in the 24 hours ending 11/03/18 0931 Last 3 Weights 11/02/2018 04/13/2016  Weight (lbs) 243 lb 279 lb  Weight (kg) 110.224 kg 126.554 kg     Body mass index is 35.37 kg/m.  General:   Moderately obese man,  NAD  HEENT: normal Lymph: no adenopathy Neck: no JVD Endocrine:  No thryomegaly Vascular: No carotid bruits; FA pulses 2+ bilaterally without bruits  Cardiac:  normal S1, S2; RRR; no murmur   Lungs:  clear to auscultation bilaterally, no wheezing, rhonchi or rales  Abd: moderate obesity  Ext: no edema Musculoskeletal:  No deformities, BUE and BLE strength normal and equal Skin: warm and dry  Neuro:  CNs 2-12 intact, no focal abnormalities noted Psych:  Normal affect   EKG:  The EKG was personally reviewed and demonstrates:   NSR , no ST or T wave changes  Telemetry:  Telemetry was personally reviewed and demonstrates:  NSR   Relevant CV Studies:   Laboratory Data:  Chemistry Recent Labs  Lab 11/02/18 1919  NA 137  K 4.5  CL 99  CO2 24  GLUCOSE 280*  BUN 17  CREATININE 0.98  CALCIUM 9.3  GFRNONAA >60  GFRAA >60  ANIONGAP 14    Recent Labs  Lab 11/02/18 2211  PROT 7.0  ALBUMIN 3.5  AST 40  ALT 21  ALKPHOS 50  BILITOT 0.9   Hematology Recent Labs  Lab 11/02/18 1919  WBC 10.5  RBC 4.46  HGB 14.3  HCT 44.1  MCV 98.9  MCH 32.1  MCHC 32.4  RDW 13.7  PLT 265   Cardiac Enzymes Recent Labs  Lab 11/03/18 0041  TROPONINI <0.03    Recent Labs  Lab  11/02/18 1929  TROPIPOC 0.01    BNPNo results for input(s): BNP, PROBNP in the last 168 hours.  DDimer No results for input(s): DDIMER in the last 168 hours.  Radiology/Studies:  Dg Chest 2 View  Result Date: 11/02/2018 CLINICAL DATA:  73 year old male with chest pain radiating to the back acute onset tonight. Shortness of breath and weakness. EXAM: CHEST - 2 VIEW COMPARISON:  None. FINDINGS: Normal cardiac size and mediastinal contours. Mild elevation of the right hemidiaphragm, normal variant. Visualized tracheal air column is within normal limits. No pneumothorax, pulmonary edema, pleural effusion or confluent pulmonary opacity. No acute osseous abnormality identified. Negative visible bowel gas pattern. Probable cholecystectomy clips in the right upper quadrant IMPRESSION: Negative. No acute cardiopulmonary abnormality. Electronically Signed   By: Odessa Fleming M.D.   On: 11/02/2018 20:16    Assessment and Plan:   1. 1.  Coronary artery disease: The patient has a history of myocardial infarction in 2008.  He apparently had 2 stents placed while he was out in [place].  He now presents with angina symptoms that are fairly similar to his presenting episodes of chest discomfort.  Pain resolved with nitroglycerin.  His troponin levels remain negative and his EKG is unremarkable.  Like to proceed with a Lexiscan Myoview study.  I will make sure that his second troponin level remains negative.  If the troponin is elevated then we will need to proceed with heart catheterization on Monday.  The patient will need to remain n.p.o. until we get the stress test done.  2.  Hyperlipidemia: His triglyceride levels  are markedly elevated.  He needs to work on a better diet and exercise and weight loss program. Will start Atorvastatin 40 mg a day         For questions or updates, please contact [facility] Please consult [email or web address] for contact info under     Signed, Kristeen MissPhilip Donyale Falcon, MD  11/03/2018  9:31 AM

## 2018-11-03 NOTE — Progress Notes (Signed)
1 day lexiscan myoview completed. Pending final result this afternoon by [facility] reader  Signed, Nyajah Hyson PA Pager: [phone]  

## 2018-11-03 NOTE — Progress Notes (Signed)
  Echocardiogram 2D Echocardiogram has been performed.  Adam Stein 11/03/2018, 3:03 PM

## 2018-11-03 NOTE — Progress Notes (Signed)
Stress test complete. Patient denies any complaints. Patient given snack and beverage. Patient taken back to nuclear medicine.

## 2018-11-03 NOTE — Plan of Care (Signed)
Discharge instructions reviewed with patient, questions answered, verbalized understanding.  Patient ambulatory accompanied by RN to main entrance to be taken home by friend.

## 2018-11-04 MED ORDER — PERFLUTREN LIPID MICROSPHERE
1.0000 mL | INTRAVENOUS | Status: AC | PRN
  Administered 2018-11-04: 2 mL via INTRAVENOUS

## 2020-07-28 DIAGNOSIS — S61451A Open bite of right hand, initial encounter: Secondary | ICD-10-CM | POA: Diagnosis not present

## 2020-07-28 DIAGNOSIS — W5501XA Bitten by cat, initial encounter: Secondary | ICD-10-CM | POA: Diagnosis not present

## 2020-07-28 DIAGNOSIS — L089 Local infection of the skin and subcutaneous tissue, unspecified: Secondary | ICD-10-CM | POA: Diagnosis not present

## 2020-09-18 IMAGING — CR DG CHEST 2V
2 series · 2 of 2 positions shown · non-contrast
Comparison: None.

CLINICAL DATA: 72-year-old male with chest pain radiating to the
back acute onset tonight. Shortness of breath and weakness.

EXAM:
CHEST - 2 VIEW

[w chest pa]
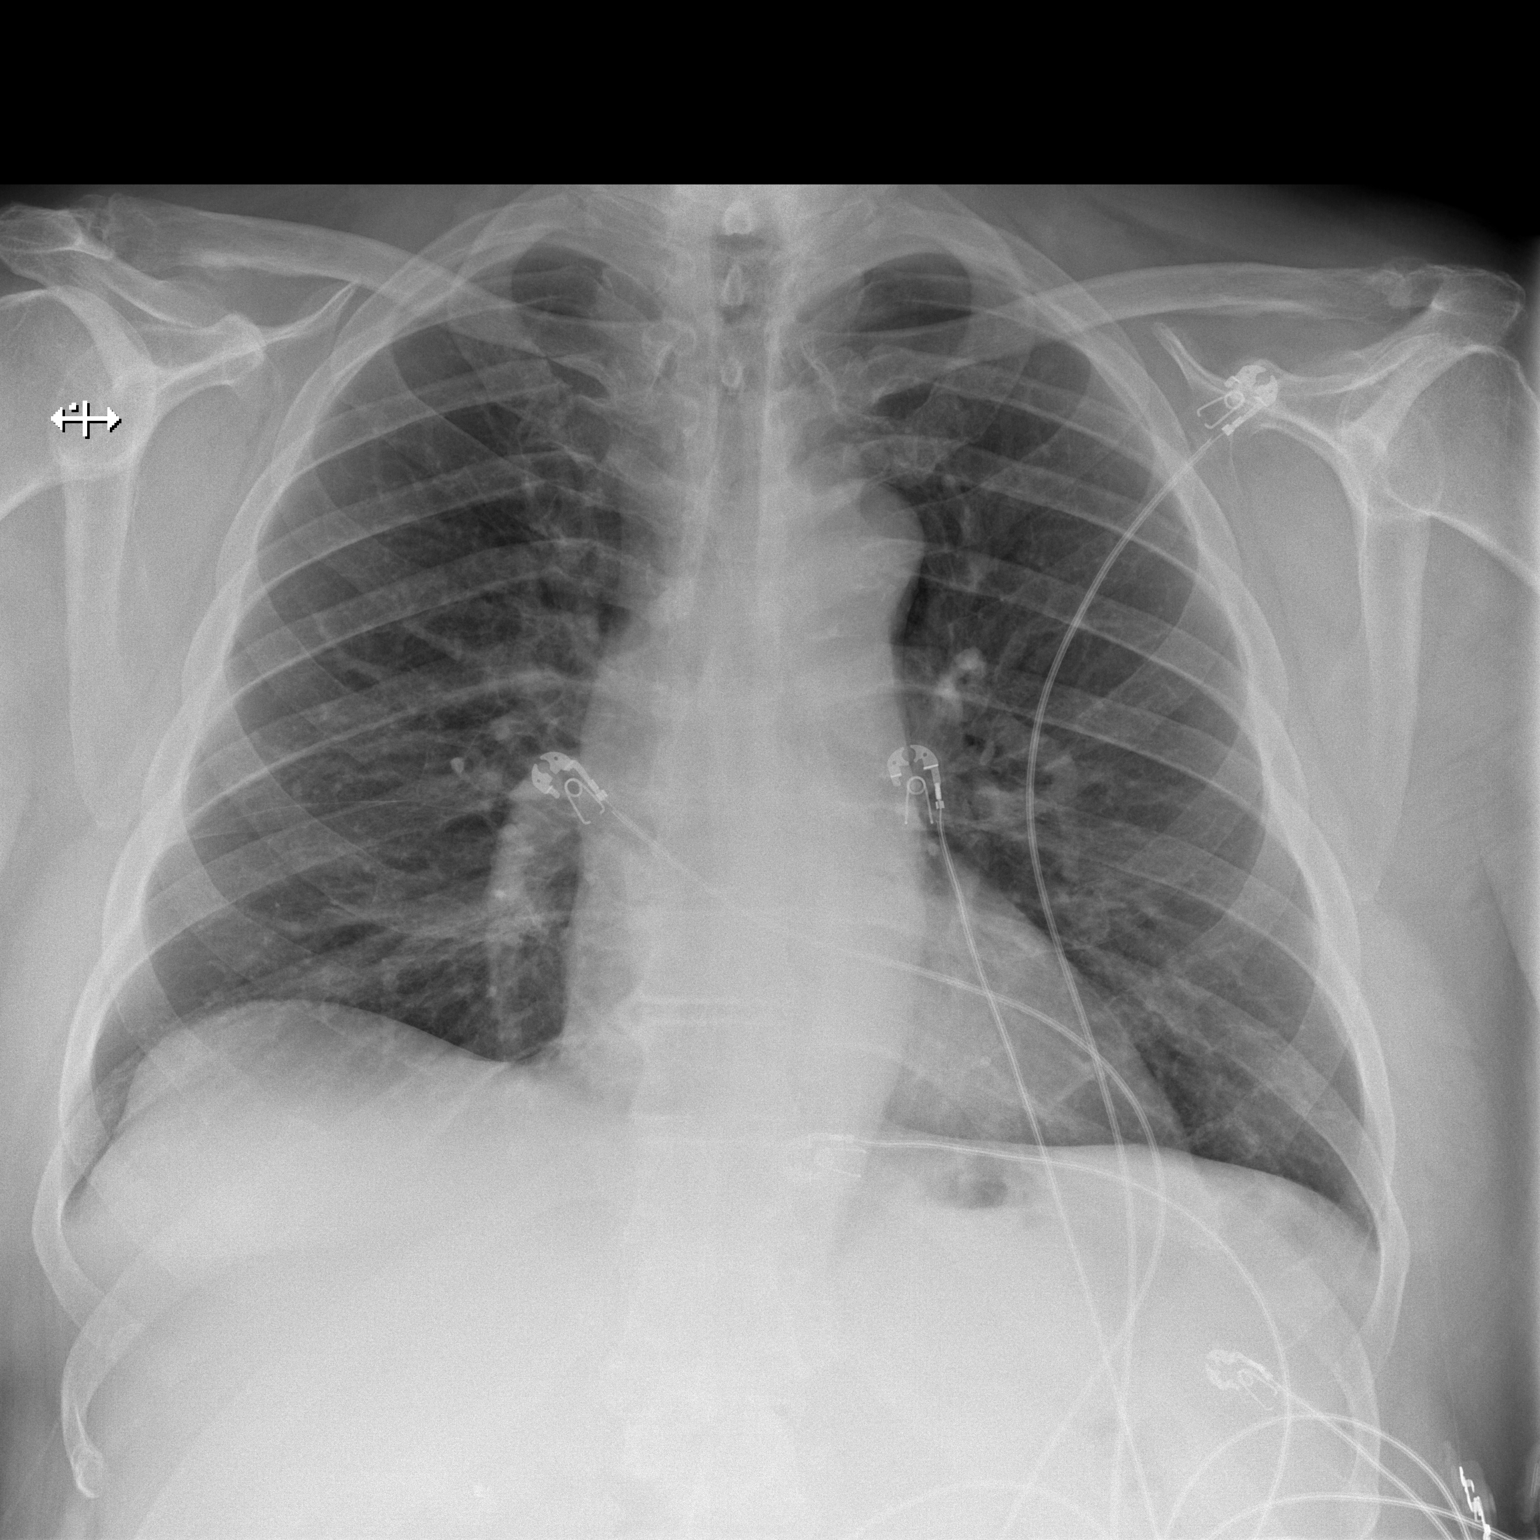

[w chest lat]
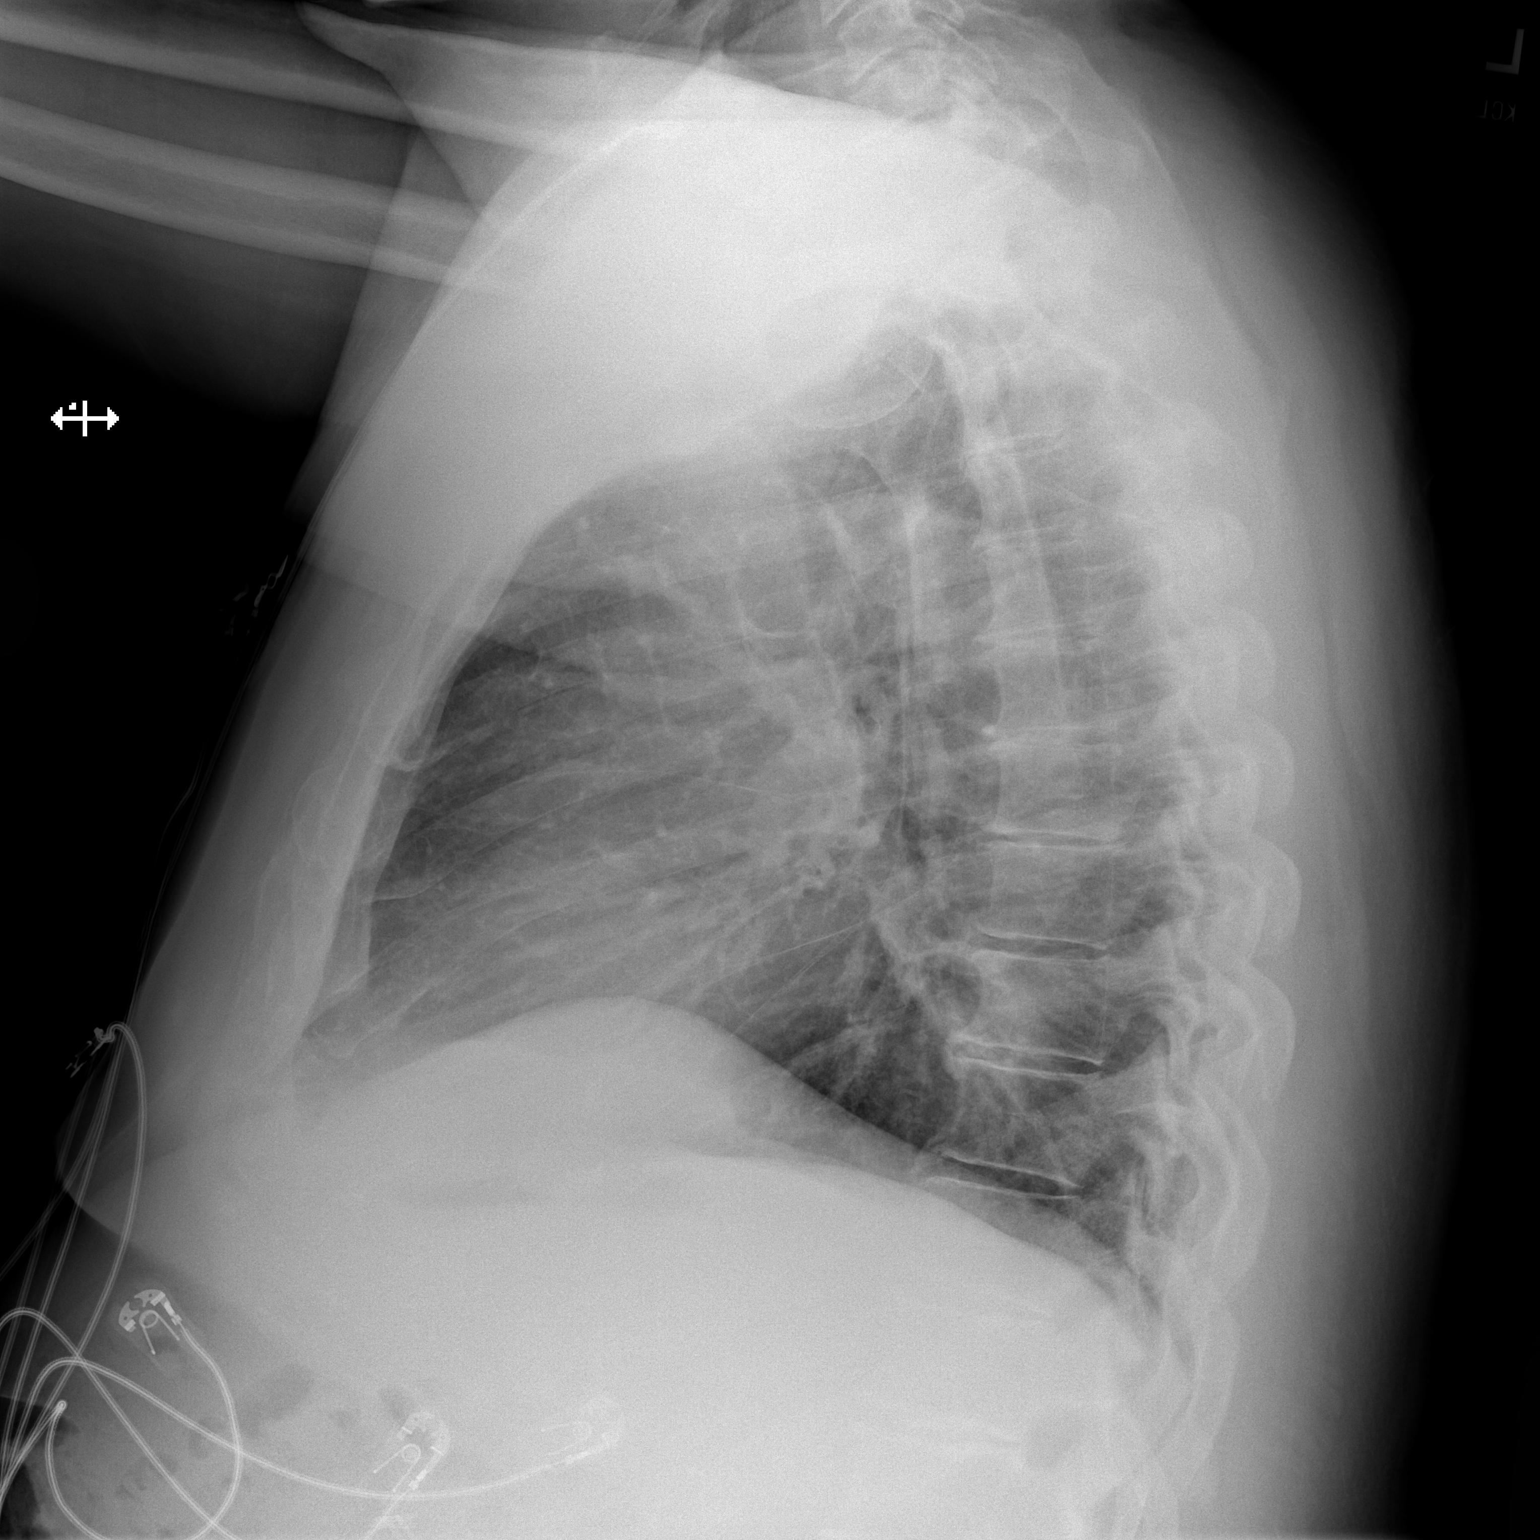

[2 of 2 positions shown; findings below may reference images not displayed]

FINDINGS: Normal cardiac size and mediastinal contours. Mild elevation of the
right hemidiaphragm, normal variant. Visualized tracheal air column
is within normal limits. No pneumothorax, pulmonary edema, pleural
effusion or confluent pulmonary opacity. No acute osseous
abnormality identified. Negative visible bowel gas pattern. Probable
cholecystectomy clips in the right upper quadrant
IMPRESSION: Negative. No acute cardiopulmonary abnormality.
